# Patient Record
Sex: Female | Born: 1965 | Race: White | Hispanic: No | Marital: Married | Smoking: Current every day smoker
Health system: Southern US, Community
[De-identification: ages and names within clinical notes are randomized; demographics above are authoritative.]

## PROBLEM LIST (undated history)

## (undated) DIAGNOSIS — J45909 Unspecified asthma, uncomplicated: Secondary | ICD-10-CM

## (undated) DIAGNOSIS — J329 Chronic sinusitis, unspecified: Secondary | ICD-10-CM

## (undated) DIAGNOSIS — J189 Pneumonia, unspecified organism: Secondary | ICD-10-CM

## (undated) DIAGNOSIS — F319 Bipolar disorder, unspecified: Secondary | ICD-10-CM

## (undated) DIAGNOSIS — J449 Chronic obstructive pulmonary disease, unspecified: Secondary | ICD-10-CM

## (undated) DIAGNOSIS — J40 Bronchitis, not specified as acute or chronic: Secondary | ICD-10-CM

## (undated) DIAGNOSIS — K649 Unspecified hemorrhoids: Secondary | ICD-10-CM

## (undated) DIAGNOSIS — J301 Allergic rhinitis due to pollen: Secondary | ICD-10-CM

## (undated) DIAGNOSIS — E785 Hyperlipidemia, unspecified: Secondary | ICD-10-CM

## (undated) DIAGNOSIS — I341 Nonrheumatic mitral (valve) prolapse: Secondary | ICD-10-CM

## (undated) DIAGNOSIS — A809 Acute poliomyelitis, unspecified: Secondary | ICD-10-CM

## (undated) DIAGNOSIS — F419 Anxiety disorder, unspecified: Secondary | ICD-10-CM

## (undated) HISTORY — DX: Nonrheumatic mitral (valve) prolapse: I34.1

## (undated) HISTORY — DX: Unspecified hemorrhoids: K64.9

## (undated) HISTORY — DX: Pneumonia, unspecified organism: J18.9

## (undated) HISTORY — DX: Chronic obstructive pulmonary disease, unspecified: J44.9

## (undated) HISTORY — DX: Hyperlipidemia, unspecified: E78.5

## (undated) HISTORY — DX: Acute poliomyelitis, unspecified: A80.9

## (undated) HISTORY — DX: Anxiety disorder, unspecified: F41.9

## (undated) HISTORY — DX: Bipolar disorder, unspecified: F31.9

## (undated) HISTORY — DX: Allergic rhinitis due to pollen: J30.1

## (undated) HISTORY — DX: Unspecified asthma, uncomplicated: J45.909

## (undated) HISTORY — DX: Bronchitis, not specified as acute or chronic: J40

## (undated) HISTORY — DX: Chronic sinusitis, unspecified: J32.9

---

## 2000-11-24 ENCOUNTER — Ambulatory Visit (HOSPITAL_COMMUNITY): Admission: RE | Admit: 2000-11-24 | Discharge: 2000-11-24 | Payer: Self-pay | Admitting: Obstetrics and Gynecology

## 2000-11-24 ENCOUNTER — Encounter: Payer: Self-pay | Admitting: Obstetrics and Gynecology

## 2000-12-04 ENCOUNTER — Inpatient Hospital Stay (HOSPITAL_COMMUNITY): Admission: EM | Admit: 2000-12-04 | Discharge: 2000-12-08 | Payer: Self-pay | Admitting: Psychiatry

## 2000-12-04 ENCOUNTER — Encounter (HOSPITAL_COMMUNITY): Payer: Self-pay | Admitting: Psychiatry

## 2002-10-17 ENCOUNTER — Other Ambulatory Visit: Admission: RE | Admit: 2002-10-17 | Discharge: 2002-10-17 | Payer: Self-pay

## 2003-04-24 ENCOUNTER — Emergency Department (HOSPITAL_COMMUNITY): Admission: EM | Admit: 2003-04-24 | Discharge: 2003-04-24 | Payer: Self-pay | Admitting: Emergency Medicine

## 2003-08-24 ENCOUNTER — Emergency Department (HOSPITAL_COMMUNITY): Admission: EM | Admit: 2003-08-24 | Discharge: 2003-08-24 | Payer: Self-pay | Admitting: Emergency Medicine

## 2005-01-24 ENCOUNTER — Emergency Department (HOSPITAL_COMMUNITY): Admission: EM | Admit: 2005-01-24 | Discharge: 2005-01-25 | Payer: Self-pay | Admitting: Emergency Medicine

## 2005-12-28 ENCOUNTER — Emergency Department (HOSPITAL_COMMUNITY): Admission: EM | Admit: 2005-12-28 | Discharge: 2005-12-28 | Payer: Self-pay | Admitting: Emergency Medicine

## 2005-12-31 ENCOUNTER — Emergency Department (HOSPITAL_COMMUNITY): Admission: EM | Admit: 2005-12-31 | Discharge: 2005-12-31 | Payer: Self-pay | Admitting: Emergency Medicine

## 2006-04-03 ENCOUNTER — Other Ambulatory Visit: Payer: Self-pay

## 2006-04-03 ENCOUNTER — Emergency Department: Payer: Self-pay | Admitting: Emergency Medicine

## 2006-04-17 ENCOUNTER — Inpatient Hospital Stay: Payer: Self-pay | Admitting: Internal Medicine

## 2006-04-17 ENCOUNTER — Other Ambulatory Visit: Payer: Self-pay

## 2006-08-05 ENCOUNTER — Emergency Department: Payer: Self-pay | Admitting: Emergency Medicine

## 2006-08-05 ENCOUNTER — Other Ambulatory Visit: Payer: Self-pay

## 2007-02-10 ENCOUNTER — Emergency Department: Payer: Self-pay | Admitting: Emergency Medicine

## 2007-02-15 ENCOUNTER — Emergency Department: Payer: Self-pay | Admitting: Emergency Medicine

## 2007-02-16 ENCOUNTER — Ambulatory Visit: Payer: Self-pay | Admitting: Emergency Medicine

## 2007-05-13 ENCOUNTER — Emergency Department: Payer: Self-pay | Admitting: Emergency Medicine

## 2007-07-10 ENCOUNTER — Ambulatory Visit: Payer: Self-pay

## 2007-07-12 ENCOUNTER — Ambulatory Visit: Payer: Self-pay

## 2007-11-29 ENCOUNTER — Other Ambulatory Visit: Payer: Self-pay

## 2007-11-29 ENCOUNTER — Emergency Department: Payer: Self-pay | Admitting: Emergency Medicine

## 2007-12-02 ENCOUNTER — Other Ambulatory Visit: Payer: Self-pay

## 2007-12-02 ENCOUNTER — Emergency Department: Payer: Self-pay | Admitting: Emergency Medicine

## 2008-01-09 ENCOUNTER — Ambulatory Visit: Payer: Self-pay

## 2008-02-16 ENCOUNTER — Emergency Department: Payer: Self-pay | Admitting: Emergency Medicine

## 2008-04-02 ENCOUNTER — Emergency Department: Payer: Self-pay | Admitting: Emergency Medicine

## 2008-07-28 ENCOUNTER — Emergency Department: Payer: Self-pay

## 2008-07-31 ENCOUNTER — Emergency Department: Payer: Self-pay | Admitting: Emergency Medicine

## 2008-09-15 ENCOUNTER — Emergency Department: Payer: Self-pay | Admitting: Emergency Medicine

## 2008-11-05 ENCOUNTER — Ambulatory Visit: Payer: Self-pay

## 2008-12-26 ENCOUNTER — Emergency Department: Payer: Self-pay | Admitting: Internal Medicine

## 2009-04-20 ENCOUNTER — Ambulatory Visit: Payer: Self-pay | Admitting: Internal Medicine

## 2009-04-20 ENCOUNTER — Emergency Department: Payer: Self-pay | Admitting: Emergency Medicine

## 2009-04-21 ENCOUNTER — Ambulatory Visit: Payer: Self-pay | Admitting: Internal Medicine

## 2009-05-01 ENCOUNTER — Emergency Department: Payer: Self-pay | Admitting: Emergency Medicine

## 2009-05-23 ENCOUNTER — Emergency Department: Payer: Self-pay | Admitting: Internal Medicine

## 2009-06-26 ENCOUNTER — Emergency Department (HOSPITAL_COMMUNITY): Admission: EM | Admit: 2009-06-26 | Discharge: 2009-06-26 | Payer: Self-pay | Admitting: Emergency Medicine

## 2009-08-27 ENCOUNTER — Emergency Department: Payer: Self-pay | Admitting: Emergency Medicine

## 2009-09-23 ENCOUNTER — Emergency Department: Payer: Self-pay | Admitting: Internal Medicine

## 2009-09-24 ENCOUNTER — Ambulatory Visit: Payer: Self-pay | Admitting: Internal Medicine

## 2010-02-05 ENCOUNTER — Emergency Department: Payer: Self-pay | Admitting: Emergency Medicine

## 2010-03-02 ENCOUNTER — Ambulatory Visit: Payer: Self-pay

## 2010-03-13 ENCOUNTER — Emergency Department: Payer: Self-pay | Admitting: Emergency Medicine

## 2010-03-14 ENCOUNTER — Emergency Department: Payer: Self-pay | Admitting: Emergency Medicine

## 2010-04-05 ENCOUNTER — Emergency Department: Payer: Self-pay | Admitting: Emergency Medicine

## 2010-04-06 ENCOUNTER — Emergency Department: Payer: Self-pay | Admitting: Unknown Physician Specialty

## 2010-04-24 ENCOUNTER — Inpatient Hospital Stay: Payer: Self-pay | Admitting: Psychiatry

## 2010-05-18 ENCOUNTER — Emergency Department (HOSPITAL_COMMUNITY)
Admission: EM | Admit: 2010-05-18 | Discharge: 2010-05-18 | Payer: Self-pay | Source: Home / Self Care | Admitting: Emergency Medicine

## 2010-07-15 ENCOUNTER — Emergency Department (HOSPITAL_COMMUNITY)
Admission: EM | Admit: 2010-07-15 | Discharge: 2010-07-15 | Disposition: A | Discharge status: Home / Self Care | Payer: Self-pay | Attending: Emergency Medicine | Admitting: Emergency Medicine

## 2010-07-15 ENCOUNTER — Emergency Department (HOSPITAL_COMMUNITY): Payer: Self-pay

## 2010-07-15 DIAGNOSIS — R072 Precordial pain: Secondary | ICD-10-CM | POA: Insufficient documentation

## 2010-07-15 DIAGNOSIS — F410 Panic disorder [episodic paroxysmal anxiety] without agoraphobia: Secondary | ICD-10-CM | POA: Insufficient documentation

## 2010-07-15 DIAGNOSIS — R0602 Shortness of breath: Secondary | ICD-10-CM | POA: Insufficient documentation

## 2010-07-15 LAB — CBC
MCV: 97.8 fL (ref 78.0–100.0)
Platelets: 291 10*3/uL (ref 150–400)
RDW: 13.2 % (ref 11.5–15.5)
WBC: 13.4 10*3/uL — ABNORMAL HIGH (ref 4.0–10.5)

## 2010-07-15 LAB — URINALYSIS, ROUTINE W REFLEX MICROSCOPIC
Bilirubin Urine: NEGATIVE
Nitrite: NEGATIVE
Specific Gravity, Urine: 1.01 (ref 1.005–1.030)
Urobilinogen, UA: 0.2 mg/dL (ref 0.0–1.0)

## 2010-07-15 LAB — DIFFERENTIAL
Basophils Absolute: 0.1 10*3/uL (ref 0.0–0.1)
Basophils Relative: 1 % (ref 0–1)
Eosinophils Absolute: 0.6 10*3/uL (ref 0.0–0.7)
Eosinophils Relative: 4 % (ref 0–5)

## 2010-07-15 LAB — BASIC METABOLIC PANEL
BUN: 12 mg/dL (ref 6–23)
Calcium: 9.5 mg/dL (ref 8.4–10.5)
GFR calc non Af Amer: >60 mL/min (ref >60)
Glucose, Bld: 100 mg/dL — ABNORMAL HIGH (ref 70–99)

## 2010-07-16 LAB — POCT PREGNANCY, URINE: Preg Test, Ur: NEGATIVE

## 2010-07-29 ENCOUNTER — Emergency Department (HOSPITAL_COMMUNITY)
Admission: EM | Admit: 2010-07-29 | Discharge: 2010-07-29 | Disposition: A | Discharge status: Home / Self Care | Payer: Self-pay | Attending: Emergency Medicine | Admitting: Emergency Medicine

## 2010-07-29 DIAGNOSIS — R112 Nausea with vomiting, unspecified: Secondary | ICD-10-CM | POA: Insufficient documentation

## 2010-07-29 DIAGNOSIS — R197 Diarrhea, unspecified: Secondary | ICD-10-CM | POA: Insufficient documentation

## 2010-07-29 DIAGNOSIS — R109 Unspecified abdominal pain: Secondary | ICD-10-CM | POA: Insufficient documentation

## 2010-07-29 LAB — URINALYSIS, ROUTINE W REFLEX MICROSCOPIC
Specific Gravity, Urine: 1.02 (ref 1.005–1.030)
Urine Glucose, Fasting: NEGATIVE mg/dL
pH: 6 (ref 5.0–8.0)

## 2010-07-29 LAB — DIFFERENTIAL
Eosinophils Absolute: 0.4 10*3/uL (ref 0.0–0.7)
Eosinophils Relative: 4 % (ref 0–5)
Lymphs Abs: 1.1 10*3/uL (ref 0.7–4.0)

## 2010-07-29 LAB — CBC
MCH: 33.9 pg (ref 26.0–34.0)
MCV: 100.9 fL — ABNORMAL HIGH (ref 78.0–100.0)
Platelets: 252 10*3/uL (ref 150–400)
RDW: 13.1 % (ref 11.5–15.5)
WBC: 8.8 10*3/uL (ref 4.0–10.5)

## 2010-07-29 LAB — LIPASE, BLOOD: Lipase: 29 U/L (ref 11–59)

## 2010-07-29 LAB — COMPREHENSIVE METABOLIC PANEL
Albumin: 3.7 g/dL (ref 3.5–5.2)
BUN: 16 mg/dL (ref 6–23)
Calcium: 8.7 mg/dL (ref 8.4–10.5)
Creatinine, Ser: 0.76 mg/dL (ref 0.4–1.2)
Potassium: 3.9 mEq/L (ref 3.5–5.1)
Total Protein: 6.3 g/dL (ref 6.0–8.3)

## 2010-07-29 LAB — URINE MICROSCOPIC-ADD ON

## 2010-08-16 LAB — COMPREHENSIVE METABOLIC PANEL
Albumin: 4.2 g/dL (ref 3.5–5.2)
Alkaline Phosphatase: 60 U/L (ref 39–117)
BUN: 10 mg/dL (ref 6–23)
Calcium: 9.6 mg/dL (ref 8.4–10.5)
Potassium: 4 mEq/L (ref 3.5–5.1)
Total Protein: 7.2 g/dL (ref 6.0–8.3)

## 2010-08-16 LAB — URINALYSIS, ROUTINE W REFLEX MICROSCOPIC
Bilirubin Urine: NEGATIVE
Hgb urine dipstick: NEGATIVE
Ketones, ur: NEGATIVE mg/dL
Nitrite: NEGATIVE
Urobilinogen, UA: 0.2 mg/dL (ref 0.0–1.0)

## 2010-08-16 LAB — WET PREP, GENITAL

## 2010-08-16 LAB — RPR: RPR Ser Ql: NONREACTIVE

## 2010-08-16 LAB — GC/CHLAMYDIA PROBE AMP, GENITAL
Chlamydia, DNA Probe: NEGATIVE
GC Probe Amp, Genital: NEGATIVE

## 2010-08-22 LAB — WET PREP, GENITAL
Trich, Wet Prep: NONE SEEN
Yeast Wet Prep HPF POC: NONE SEEN

## 2010-08-22 LAB — URINALYSIS, ROUTINE W REFLEX MICROSCOPIC
Glucose, UA: NEGATIVE mg/dL
Ketones, ur: NEGATIVE mg/dL
Nitrite: NEGATIVE
Specific Gravity, Urine: 1.025 (ref 1.005–1.030)
pH: 6 (ref 5.0–8.0)

## 2010-08-22 LAB — PREGNANCY, URINE: Preg Test, Ur: NEGATIVE

## 2010-09-16 ENCOUNTER — Emergency Department (HOSPITAL_COMMUNITY): Payer: Self-pay

## 2010-09-16 ENCOUNTER — Emergency Department (HOSPITAL_COMMUNITY)
Admission: EM | Admit: 2010-09-16 | Discharge: 2010-09-16 | Disposition: A | Discharge status: Home / Self Care | Payer: Self-pay | Attending: Emergency Medicine | Admitting: Emergency Medicine

## 2010-09-16 DIAGNOSIS — K5900 Constipation, unspecified: Secondary | ICD-10-CM | POA: Insufficient documentation

## 2010-09-16 DIAGNOSIS — R109 Unspecified abdominal pain: Secondary | ICD-10-CM | POA: Insufficient documentation

## 2010-09-16 DIAGNOSIS — M549 Dorsalgia, unspecified: Secondary | ICD-10-CM | POA: Insufficient documentation

## 2010-09-16 LAB — COMPREHENSIVE METABOLIC PANEL WITH GFR
ALT: 32 U/L (ref 0–35)
AST: 28 U/L (ref 0–37)
Albumin: 3.7 g/dL (ref 3.5–5.2)
Alkaline Phosphatase: 64 U/L (ref 39–117)
BUN: 6 mg/dL (ref 6–23)
CO2: 23 meq/L (ref 19–32)
Calcium: 8.9 mg/dL (ref 8.4–10.5)
Chloride: 108 meq/L (ref 96–112)
Creatinine, Ser: 0.78 mg/dL (ref 0.4–1.2)
GFR calc Af Amer: >60 mL/min (ref >60)
GFR calc non Af Amer: >60 mL/min (ref >60)
Glucose, Bld: 81 mg/dL (ref 70–99)
Potassium: 3.8 meq/L (ref 3.5–5.1)
Sodium: 137 meq/L (ref 135–145)
Total Bilirubin: 0.4 mg/dL (ref 0.3–1.2)
Total Protein: 6.5 g/dL (ref 6.0–8.3)

## 2010-09-16 LAB — CBC
HCT: 38.7 % (ref 36.0–46.0)
Hemoglobin: 13.1 g/dL (ref 12.0–15.0)
MCH: 33.9 pg (ref 26.0–34.0)
MCHC: 33.9 g/dL (ref 30.0–36.0)
MCV: 100 fL (ref 78.0–100.0)
Platelets: 223 K/uL (ref 150–400)
RBC: 3.87 MIL/uL (ref 3.87–5.11)
RDW: 12.5 % (ref 11.5–15.5)
WBC: 8.8 K/uL (ref 4.0–10.5)

## 2010-09-16 LAB — URINALYSIS, ROUTINE W REFLEX MICROSCOPIC
Bilirubin Urine: NEGATIVE
Glucose, UA: NEGATIVE mg/dL
Hgb urine dipstick: NEGATIVE
Ketones, ur: NEGATIVE mg/dL
Specific Gravity, Urine: 1.01 (ref 1.005–1.030)
pH: 7 (ref 5.0–8.0)

## 2010-09-16 LAB — DIFFERENTIAL
Basophils Absolute: 0.1 10*3/uL (ref 0.0–0.1)
Eosinophils Relative: 5 % (ref 0–5)
Lymphocytes Relative: 41 % (ref 12–46)
Lymphs Abs: 3.6 10*3/uL (ref 0.7–4.0)
Monocytes Absolute: 0.8 10*3/uL (ref 0.1–1.0)
Neutro Abs: 3.9 10*3/uL (ref 1.7–7.7)
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
## 2010-11-17 IMAGING — CR DG CHEST 2V
1 series · 3 of 3 positions shown · non-contrast
Comparison: none

REASON FOR EXAM: SOB
COMMENTS:

[Series 1: view not recorded · 0.17mm/px · 3 of 3 slices shown]
[im 1/3]
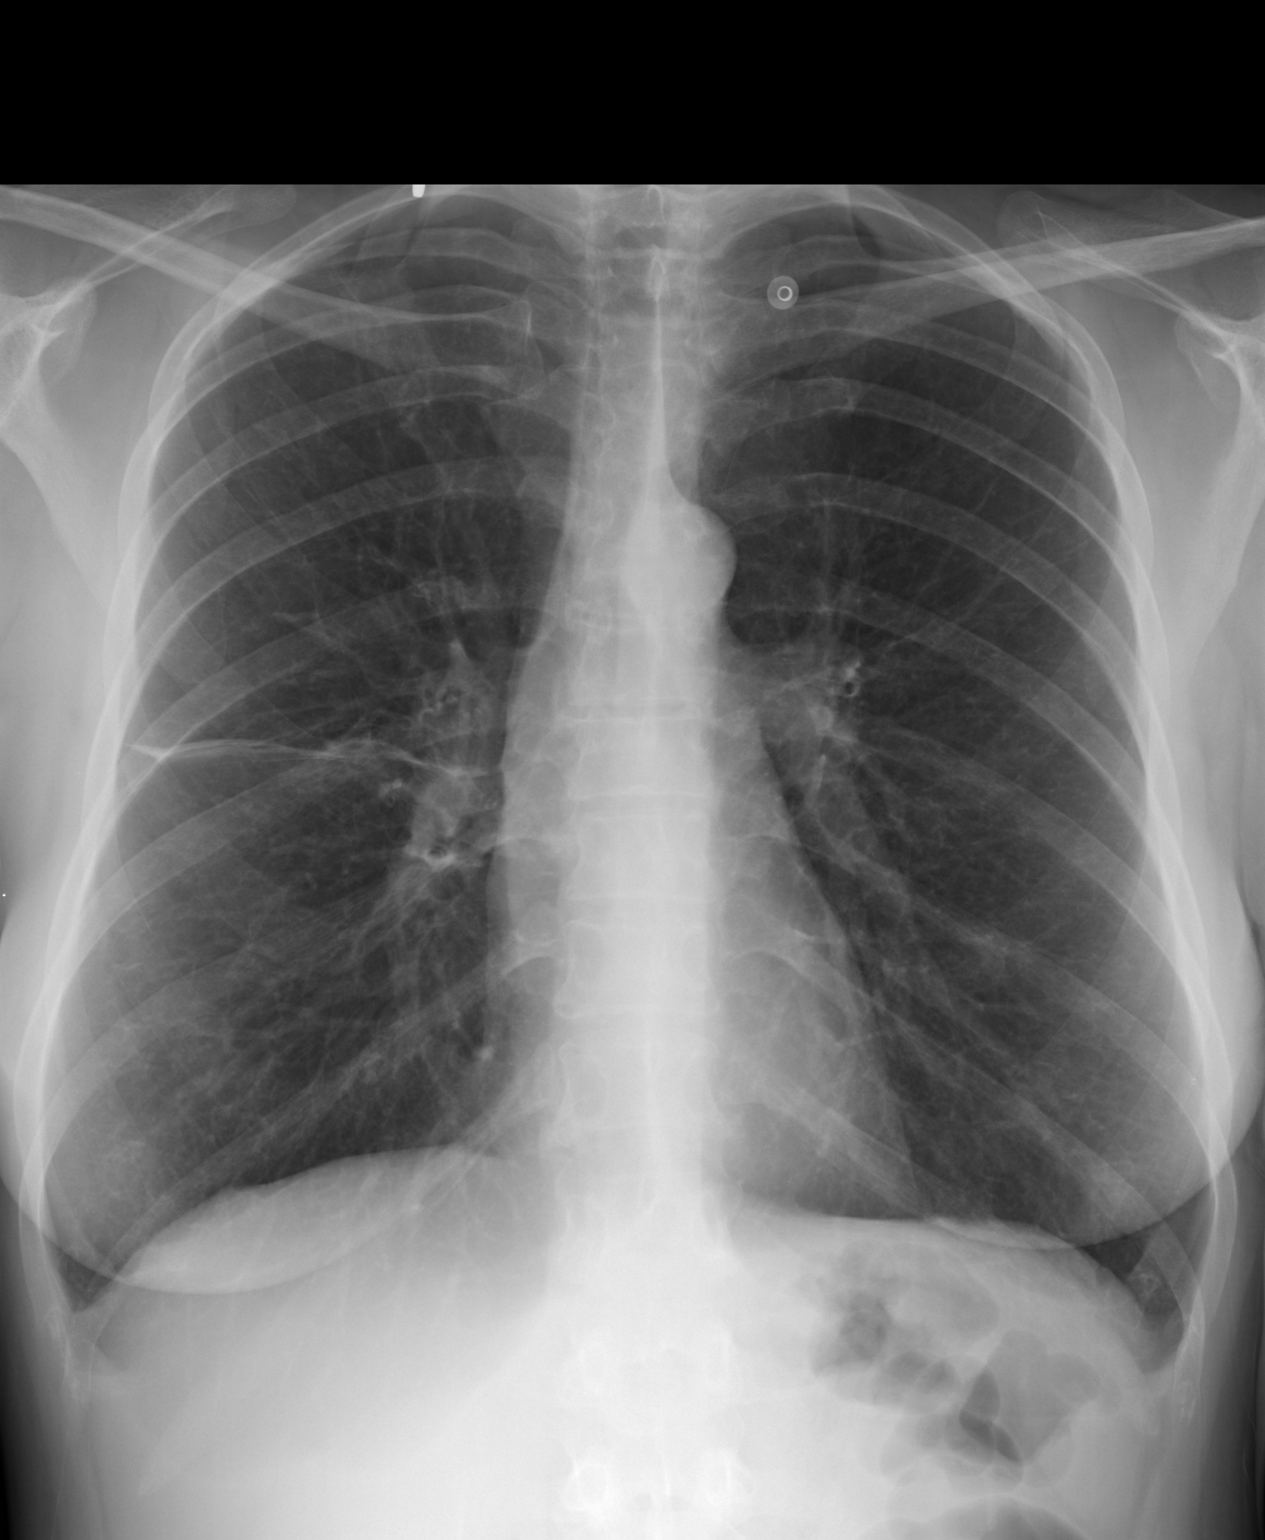
[im 2/3]
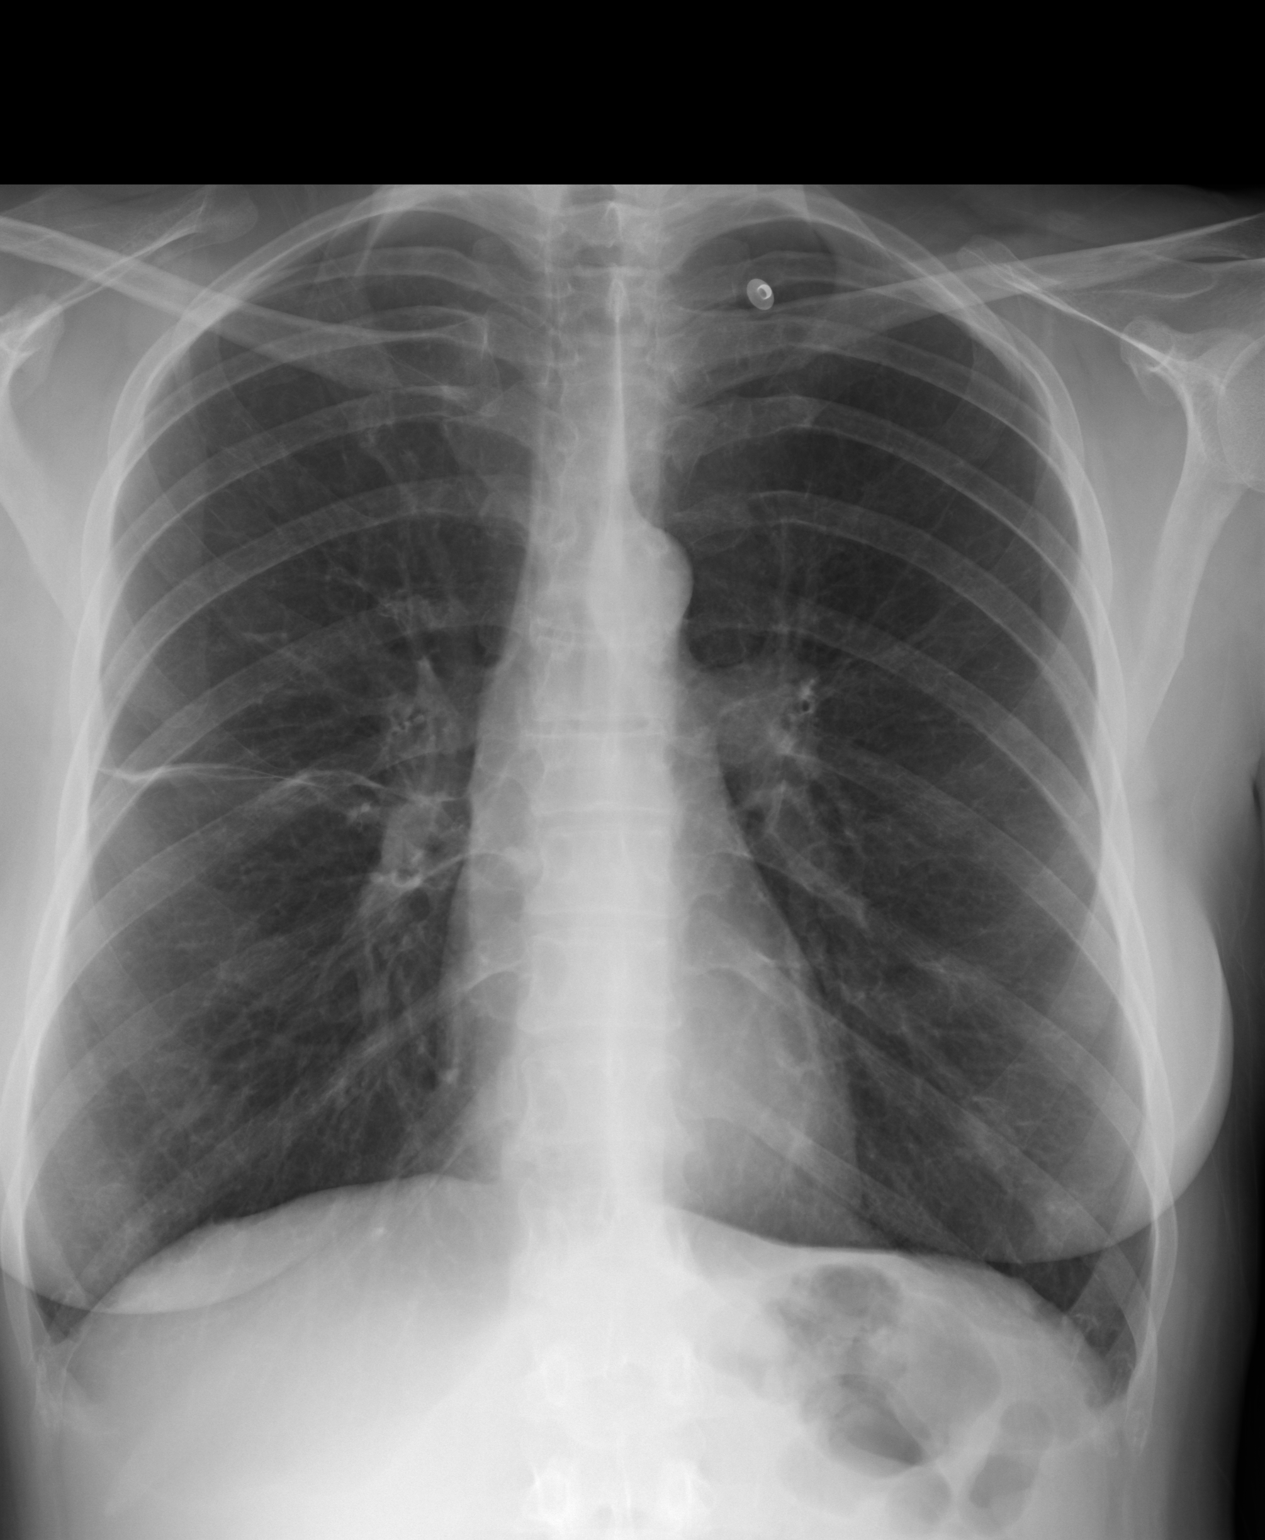
[im 3/3]
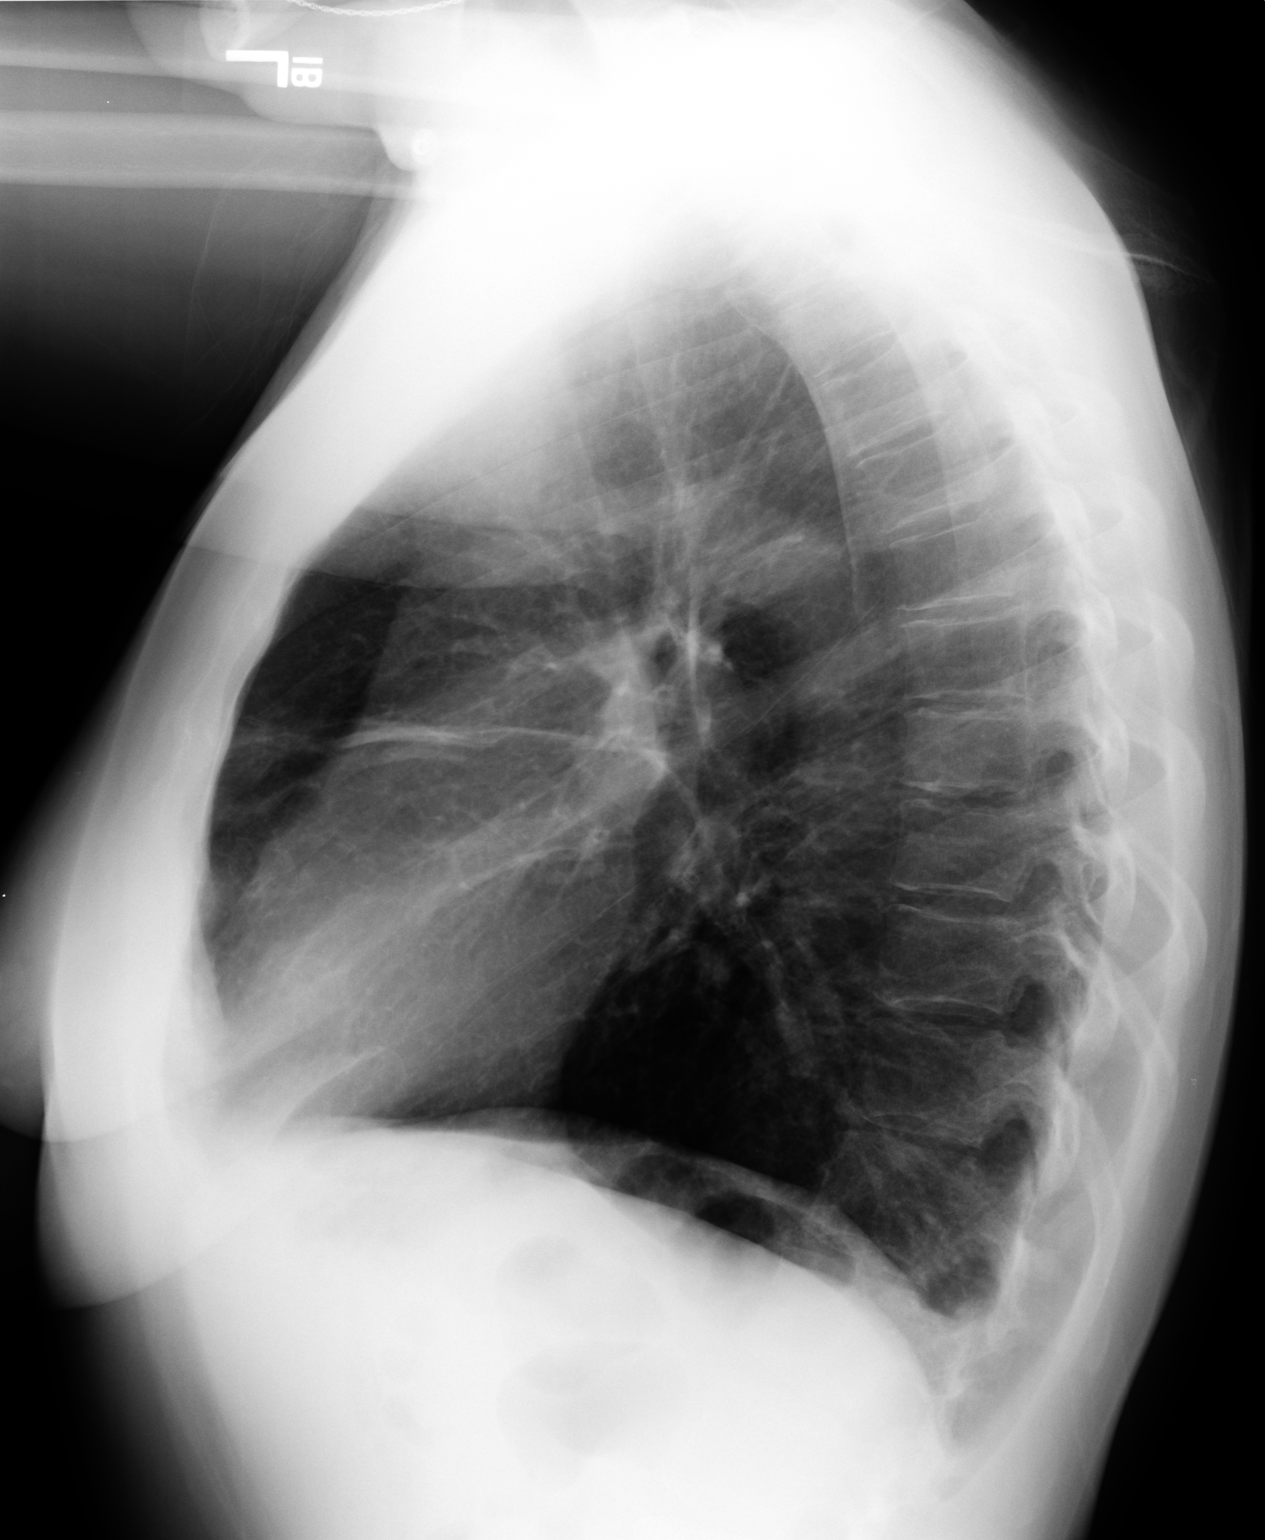

[3 of 3 positions shown; findings below may reference images not displayed]

PROCEDURE:     DXR - DXR CHEST PA (OR AP) AND LATERAL  - September 23, 2009  [DATE]

RESULT:     Comparison is made to the study of 08/27/2009.

There is thickening of the minor fissure. The lungs are otherwise clear
except for blunting of the right costophrenic angle possibly secondary to a
trace right pleural effusion. There is thickening in the major fissure on
the left as well. The cardiac silhouette is normal. There is no
pneumothorax, infiltrate or edema. The cardiac silhouette is normal.
IMPRESSION: 1. Trace right pleural effusion.

## 2010-12-09 ENCOUNTER — Emergency Department: Payer: Self-pay | Admitting: Emergency Medicine

## 2011-01-06 ENCOUNTER — Ambulatory Visit: Payer: Self-pay | Admitting: Internal Medicine

## 2011-03-21 ENCOUNTER — Emergency Department: Payer: Self-pay | Admitting: Emergency Medicine

## 2011-04-26 IMAGING — US US BREAST BILAT
1 series · 17 of 23 positions shown · non-contrast
Comparison: none

REASON FOR EXAM: green right nipple discharge/2 cm nodule [DATE] left
breast
COMMENTS:

[Series 1: us breast bilat · 17 of 23 slices shown]
[im 1/23]
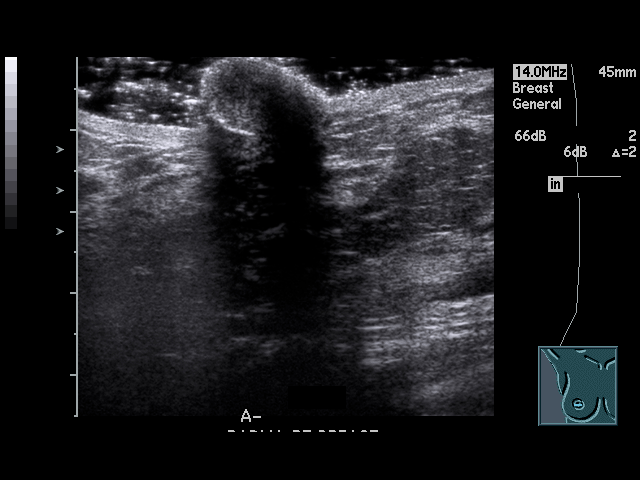
[im 3/23]
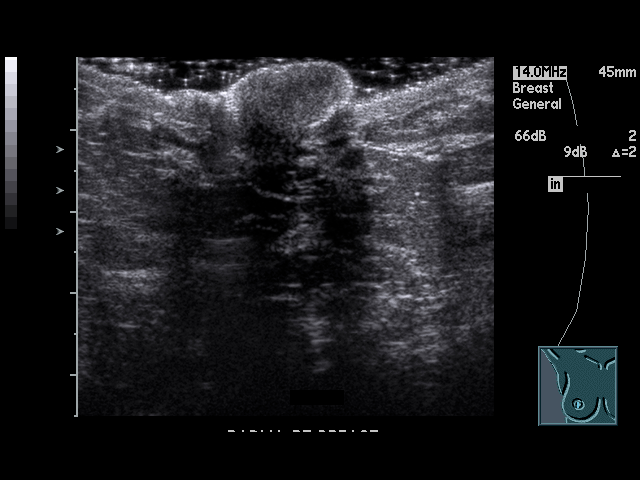
[im 4/23]
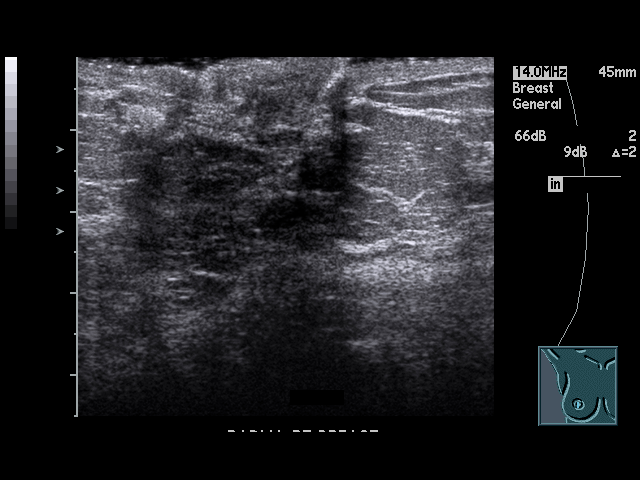
[im 5/23]
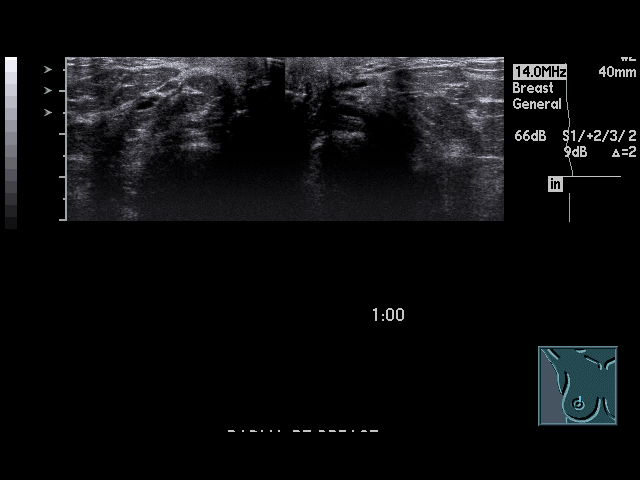
[im 7/23]
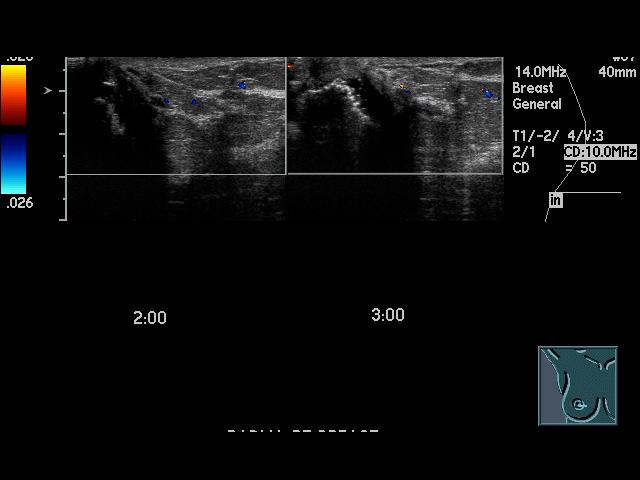
[im 8/23]
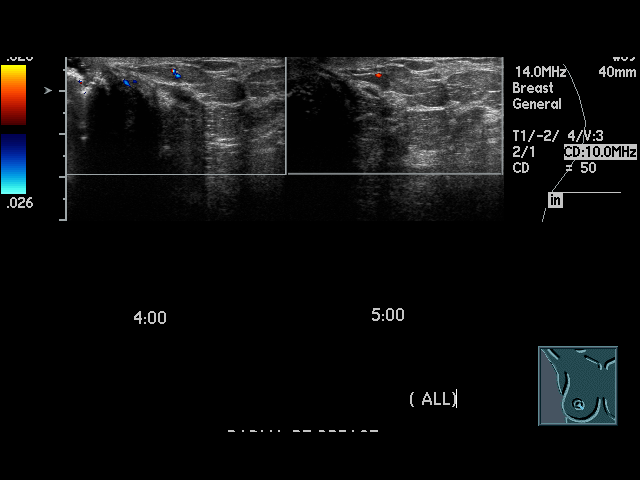
[im 9/23]
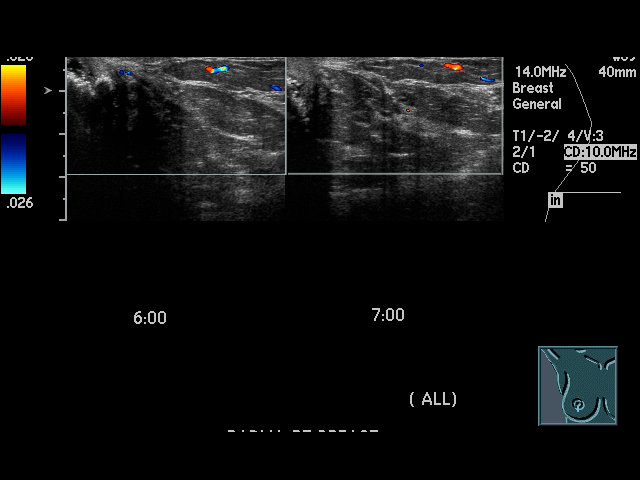
[im 11/23]
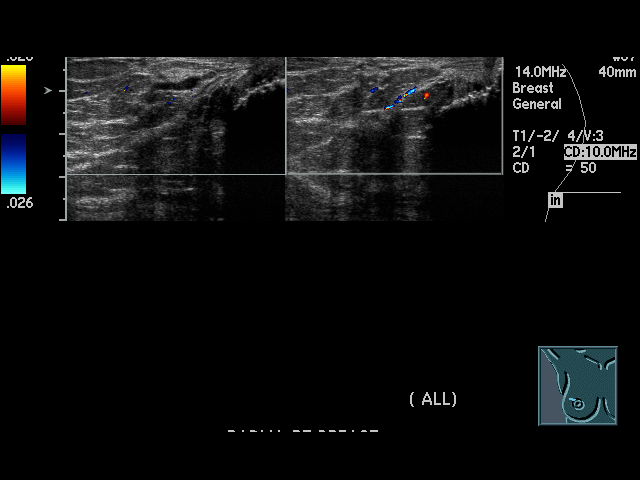
[im 12/23]
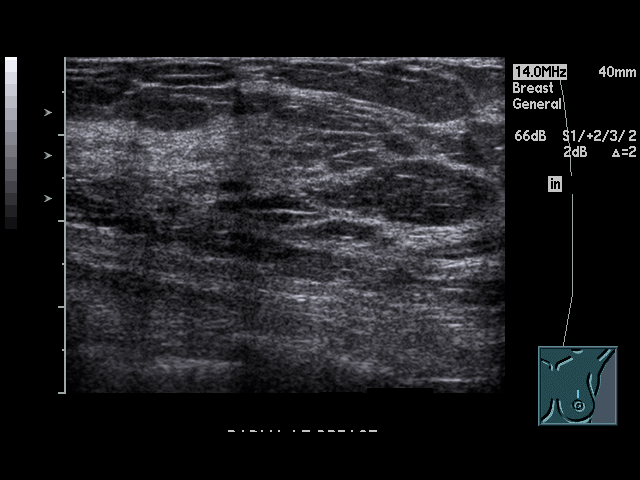
[im 13/23]
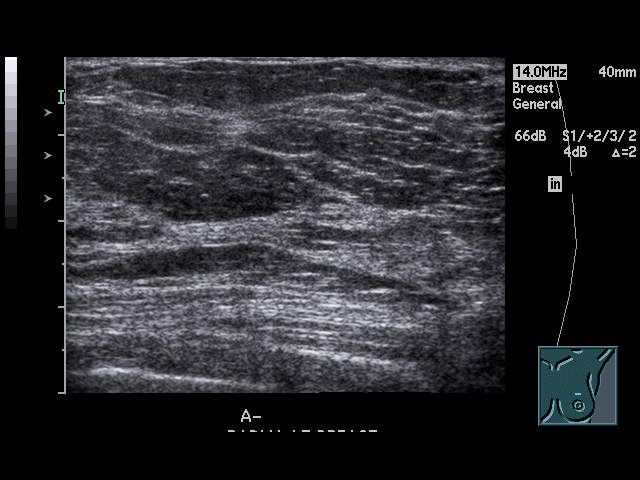
[im 15/23]
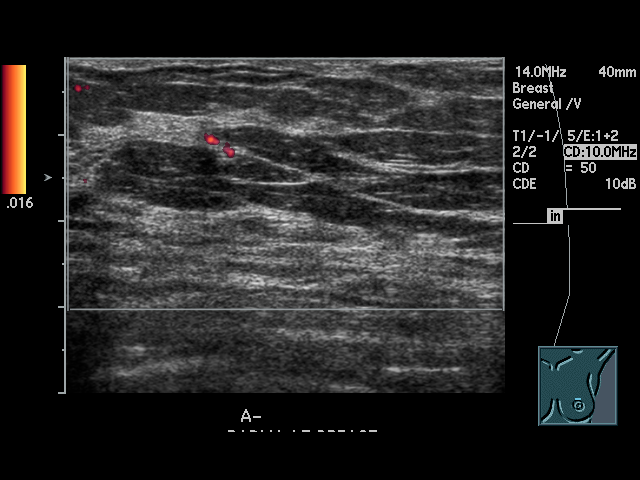
[im 16/23]
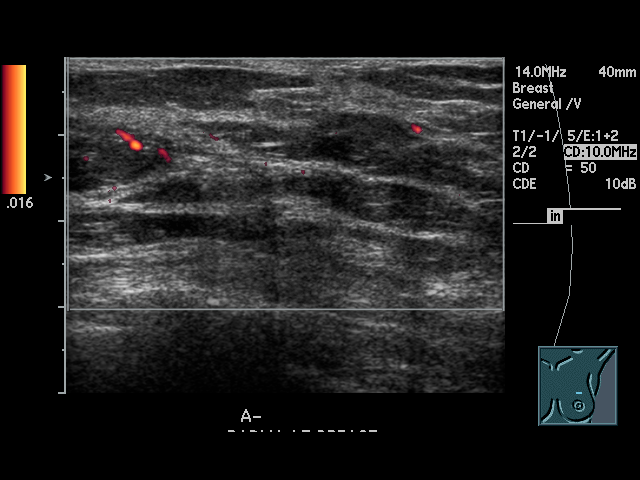
[im 17/23]
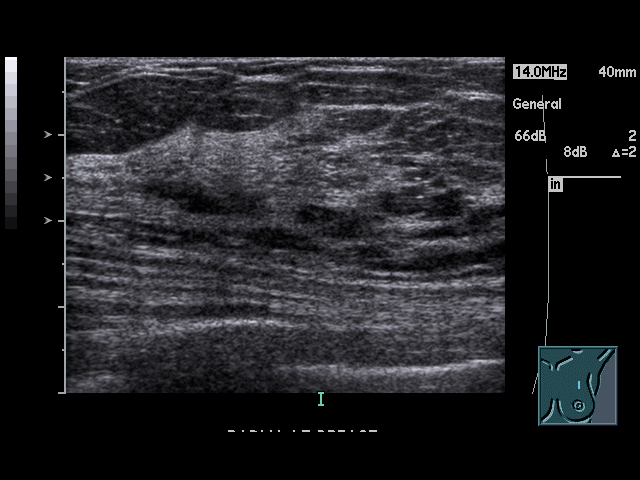
[im 19/23]
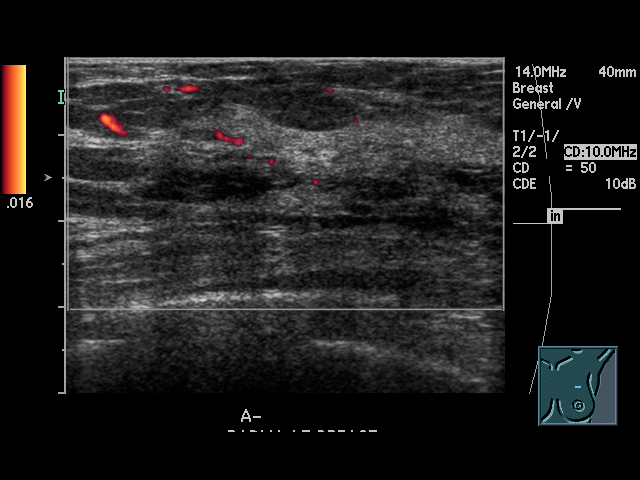
[im 20/23]
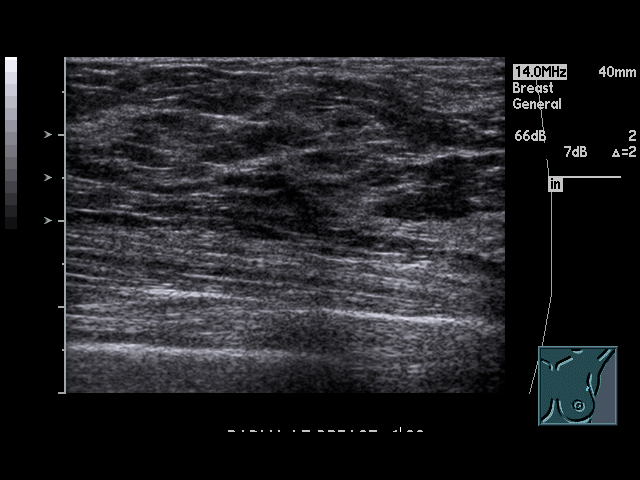
[im 21/23]
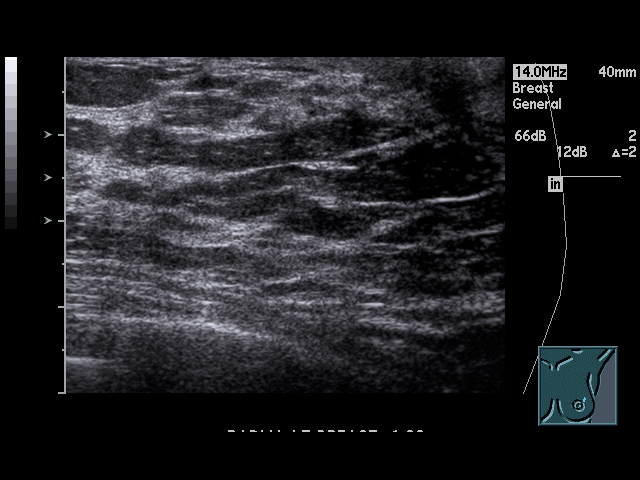
[im 23/23]
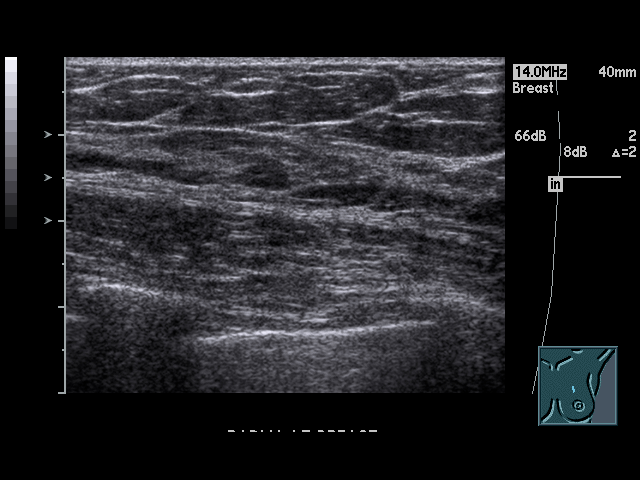

[17 of 23 positions shown; findings below may reference images not displayed]

PROCEDURE:     US  - US BIL BREAST ([REDACTED])  - March 02, 2010 [DATE]

RESULT:     Bilateral Breast Ultrasound reveals prominent ducts above the
right nipple. No abnormality otherwise identified. There is no evidence of
mass lesion. The previously identified mass lesion in the left breast is no
longer identified.
IMPRESSION: Right breast ductal ectasia. Given the patient's symptoms,
surgical consultation should be considered.

BI-RADS: Category 4 - Suspicious Abnormality
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
## 2011-07-12 ENCOUNTER — Emergency Department: Payer: Self-pay | Admitting: Emergency Medicine

## 2011-07-12 LAB — COMPREHENSIVE METABOLIC PANEL
Albumin: 3.4 g/dL (ref 3.4–5.0)
Anion Gap: 9 (ref 7–16)
Calcium, Total: 8.7 mg/dL (ref 8.5–10.1)
Chloride: 109 mmol/L — ABNORMAL HIGH (ref 98–107)
EGFR (African American): >60
Glucose: 82 mg/dL (ref 65–99)
Potassium: 3.8 mmol/L (ref 3.5–5.1)
SGOT(AST): 16 U/L (ref 15–37)

## 2011-07-12 LAB — URINALYSIS, COMPLETE
Nitrite: NEGATIVE
Protein: NEGATIVE
Specific Gravity: 1.005 (ref 1.003–1.030)
Squamous Epithelial: 4

## 2011-07-12 LAB — CBC WITH DIFFERENTIAL/PLATELET
Basophil #: 0.1 10*3/uL (ref 0.0–0.1)
Eosinophil #: 0.6 10*3/uL (ref 0.0–0.7)
HCT: 36.5 % (ref 35.0–47.0)
Lymphocyte #: 3.4 10*3/uL (ref 1.0–3.6)
Lymphocyte %: 36.6 %
MCHC: 33.5 g/dL (ref 32.0–36.0)
Monocyte #: 1.1 10*3/uL — ABNORMAL HIGH (ref 0.0–0.7)
Neutrophil #: 4.1 10*3/uL (ref 1.4–6.5)
Platelet: 223 10*3/uL (ref 150–440)
RDW: 11.7 % (ref 11.5–14.5)

## 2011-07-12 LAB — DRUG SCREEN, URINE
Cannabinoid 50 Ng, Ur ~~LOC~~: NEGATIVE (ref ?–50)
Cocaine Metabolite,Ur ~~LOC~~: NEGATIVE (ref ?–300)
MDMA (Ecstasy)Ur Screen: NEGATIVE (ref ?–500)
Opiate, Ur Screen: NEGATIVE (ref ?–300)
Phencyclidine (PCP) Ur S: NEGATIVE (ref ?–25)

## 2012-08-20 ENCOUNTER — Emergency Department: Payer: Self-pay | Admitting: Unknown Physician Specialty

## 2012-10-23 ENCOUNTER — Emergency Department: Payer: Self-pay | Admitting: Emergency Medicine

## 2012-10-23 LAB — BASIC METABOLIC PANEL
BUN: 11 mg/dL (ref 7–18)
Calcium, Total: 8.9 mg/dL (ref 8.5–10.1)
Chloride: 109 mmol/L — ABNORMAL HIGH (ref 98–107)
Creatinine: 0.76 mg/dL (ref 0.60–1.30)
Glucose: 96 mg/dL (ref 65–99)
Potassium: 4.3 mmol/L (ref 3.5–5.1)

## 2012-10-23 LAB — CBC
HGB: 14.2 g/dL (ref 12.0–16.0)
MCH: 34.5 pg — ABNORMAL HIGH (ref 26.0–34.0)
MCV: 99 fL (ref 80–100)
RBC: 4.11 10*6/uL (ref 3.80–5.20)
RDW: 13.1 % (ref 11.5–14.5)

## 2012-10-23 LAB — TROPONIN I: Troponin-I: <0.02 ng/mL

## 2012-11-05 ENCOUNTER — Emergency Department: Payer: Self-pay | Admitting: Emergency Medicine

## 2012-11-05 LAB — DRUG SCREEN, URINE
Amphetamines, Ur Screen: NEGATIVE (ref ?–1000)
Barbiturates, Ur Screen: NEGATIVE (ref ?–200)
Benzodiazepine, Ur Scrn: NEGATIVE (ref ?–200)
Methadone, Ur Screen: NEGATIVE (ref ?–300)
Phencyclidine (PCP) Ur S: NEGATIVE (ref ?–25)
Tricyclic, Ur Screen: NEGATIVE (ref ?–1000)

## 2012-11-05 LAB — BASIC METABOLIC PANEL
BUN: 14 mg/dL (ref 7–18)
EGFR (African American): >60
Glucose: 72 mg/dL (ref 65–99)
Osmolality: 275 (ref 275–301)
Potassium: 3.7 mmol/L (ref 3.5–5.1)

## 2012-11-05 LAB — CBC
HGB: 14 g/dL (ref 12.0–16.0)
MCV: 99 fL (ref 80–100)

## 2012-11-05 LAB — URINALYSIS, COMPLETE
Bilirubin,UR: NEGATIVE
Blood: NEGATIVE
Ketone: NEGATIVE
Nitrite: NEGATIVE
Ph: 6 (ref 4.5–8.0)
RBC,UR: 153 /HPF (ref 0–5)
Specific Gravity: 1.023 (ref 1.003–1.030)

## 2013-09-11 ENCOUNTER — Encounter: Payer: Self-pay | Admitting: *Deleted

## 2013-09-20 ENCOUNTER — Ambulatory Visit: Payer: Self-pay | Admitting: Cardiovascular Disease

## 2013-10-05 ENCOUNTER — Emergency Department: Payer: Self-pay | Admitting: Emergency Medicine

## 2013-10-11 LAB — URINALYSIS, COMPLETE
BLOOD: NEGATIVE
Bilirubin,UR: NEGATIVE
GLUCOSE, UR: NEGATIVE mg/dL (ref 0–75)
Hyaline Cast: 17
Leukocyte Esterase: NEGATIVE
Nitrite: NEGATIVE
Ph: 5 (ref 4.5–8.0)
Protein: 30
RBC,UR: 18 /HPF (ref 0–5)
Specific Gravity: 1.032 (ref 1.003–1.030)
Squamous Epithelial: <1

## 2013-10-11 LAB — CBC
HCT: 45.1 % (ref 35.0–47.0)
HGB: 15.4 g/dL (ref 12.0–16.0)
MCH: 34.7 pg — AB (ref 26.0–34.0)
MCHC: 34.1 g/dL (ref 32.0–36.0)
MCV: 102 fL — ABNORMAL HIGH (ref 80–100)
PLATELETS: 338 10*3/uL (ref 150–440)
RBC: 4.44 10*6/uL (ref 3.80–5.20)
RDW: 13.1 % (ref 11.5–14.5)
WBC: 8.7 10*3/uL (ref 3.6–11.0)

## 2013-10-11 LAB — DRUG SCREEN, URINE
AMPHETAMINES, UR SCREEN: NEGATIVE (ref ?–1000)
BARBITURATES, UR SCREEN: NEGATIVE (ref ?–200)
Benzodiazepine, Ur Scrn: POSITIVE (ref ?–200)
CANNABINOID 50 NG, UR ~~LOC~~: NEGATIVE (ref ?–50)
COCAINE METABOLITE, UR ~~LOC~~: POSITIVE (ref ?–300)
MDMA (ECSTASY) UR SCREEN: NEGATIVE (ref ?–500)
METHADONE, UR SCREEN: NEGATIVE (ref ?–300)
OPIATE, UR SCREEN: POSITIVE (ref ?–300)
PHENCYCLIDINE (PCP) UR S: NEGATIVE (ref ?–25)
TRICYCLIC, UR SCREEN: POSITIVE (ref ?–1000)

## 2013-10-11 LAB — COMPREHENSIVE METABOLIC PANEL
ALBUMIN: 3.9 g/dL (ref 3.4–5.0)
ALT: 25 U/L (ref 12–78)
Alkaline Phosphatase: 117 U/L
Anion Gap: 7 (ref 7–16)
BILIRUBIN TOTAL: 0.2 mg/dL (ref 0.2–1.0)
BUN: 13 mg/dL (ref 7–18)
CALCIUM: 9.5 mg/dL (ref 8.5–10.1)
CHLORIDE: 112 mmol/L — AB (ref 98–107)
CREATININE: 0.87 mg/dL (ref 0.60–1.30)
Co2: 23 mmol/L (ref 21–32)
EGFR (African American): >60
GLUCOSE: 112 mg/dL — AB (ref 65–99)
OSMOLALITY: 284 (ref 275–301)
POTASSIUM: 4 mmol/L (ref 3.5–5.1)
SGOT(AST): 26 U/L (ref 15–37)
Sodium: 142 mmol/L (ref 136–145)
Total Protein: 7.9 g/dL (ref 6.4–8.2)

## 2013-10-11 LAB — ACETAMINOPHEN LEVEL: Acetaminophen: <2 ug/mL

## 2013-10-11 LAB — ETHANOL: Ethanol %: <0.003 % (ref 0.000–0.080)

## 2013-10-11 LAB — SALICYLATE LEVEL: SALICYLATES, SERUM: 5.3 mg/dL — AB

## 2013-10-12 ENCOUNTER — Inpatient Hospital Stay: Payer: Self-pay | Admitting: Psychiatry

## 2013-11-14 ENCOUNTER — Emergency Department: Payer: Self-pay | Admitting: Emergency Medicine

## 2014-02-02 ENCOUNTER — Emergency Department: Payer: Self-pay | Admitting: Emergency Medicine

## 2014-02-02 LAB — CBC
HCT: 43.1 % (ref 35.0–47.0)
HGB: 14.3 g/dL (ref 12.0–16.0)
MCH: 33.9 pg (ref 26.0–34.0)
MCHC: 33.2 g/dL (ref 32.0–36.0)
MCV: 102 fL — ABNORMAL HIGH (ref 80–100)
PLATELETS: 305 10*3/uL (ref 150–440)
RBC: 4.23 10*6/uL (ref 3.80–5.20)
RDW: 12.8 % (ref 11.5–14.5)
WBC: 9.8 10*3/uL (ref 3.6–11.0)

## 2014-02-02 LAB — BASIC METABOLIC PANEL
ANION GAP: 5 — AB (ref 7–16)
BUN: 12 mg/dL (ref 7–18)
CALCIUM: 9 mg/dL (ref 8.5–10.1)
CO2: 28 mmol/L (ref 21–32)
CREATININE: 0.94 mg/dL (ref 0.60–1.30)
Chloride: 108 mmol/L — ABNORMAL HIGH (ref 98–107)
EGFR (African American): >60
EGFR (Non-African Amer.): >60
GLUCOSE: 96 mg/dL (ref 65–99)
Osmolality: 281 (ref 275–301)
Potassium: 4.4 mmol/L (ref 3.5–5.1)
Sodium: 141 mmol/L (ref 136–145)

## 2014-05-05 ENCOUNTER — Emergency Department: Payer: Self-pay | Admitting: Student

## 2014-09-24 ENCOUNTER — Emergency Department: Admit: 2014-09-24 | Discharge status: Against Medical Advice | Payer: Self-pay | Admitting: Emergency Medicine
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
## 2014-10-02 ENCOUNTER — Emergency Department
Admit: 2014-10-02 | Disposition: A | Discharge status: Home / Self Care | Payer: Self-pay | Admitting: Emergency Medicine

## 2014-10-02 LAB — CBC
HCT: 45.7 % (ref 35.0–47.0)
HGB: 15.3 g/dL (ref 12.0–16.0)
MCH: 33 pg (ref 26.0–34.0)
MCHC: 33.5 g/dL (ref 32.0–36.0)
MCV: 99 fL (ref 80–100)
Platelet: 339 10*3/uL (ref 150–440)
RBC: 4.63 10*6/uL (ref 3.80–5.20)
RDW: 13.5 % (ref 11.5–14.5)
WBC: 10.6 10*3/uL (ref 3.6–11.0)

## 2014-10-02 LAB — URINALYSIS, COMPLETE
BACTERIA: NONE SEEN
BILIRUBIN, UR: NEGATIVE
GLUCOSE, UR: NEGATIVE mg/dL (ref 0–75)
Ketone: NEGATIVE
LEUKOCYTE ESTERASE: NEGATIVE
Nitrite: NEGATIVE
Ph: 5 (ref 4.5–8.0)
Protein: NEGATIVE
Specific Gravity: 1.012 (ref 1.003–1.030)

## 2014-10-02 LAB — COMPREHENSIVE METABOLIC PANEL
ANION GAP: 8 (ref 7–16)
Albumin: 4.3 g/dL
Alkaline Phosphatase: 126 U/L
BILIRUBIN TOTAL: 0.5 mg/dL
BUN: 13 mg/dL
CHLORIDE: 109 mmol/L
CO2: 21 mmol/L — AB
CREATININE: 0.87 mg/dL
Calcium, Total: 9.2 mg/dL
Glucose: 111 mg/dL — ABNORMAL HIGH
POTASSIUM: 4.3 mmol/L
SGOT(AST): 25 U/L
SGPT (ALT): 14 U/L
SODIUM: 138 mmol/L
Total Protein: 7.6 g/dL

## 2014-10-02 LAB — DRUG SCREEN, URINE
AMPHETAMINES, UR SCREEN: NEGATIVE
Barbiturates, Ur Screen: NEGATIVE
Benzodiazepine, Ur Scrn: POSITIVE
COCAINE METABOLITE, UR ~~LOC~~: NEGATIVE
Cannabinoid 50 Ng, Ur ~~LOC~~: NEGATIVE
MDMA (Ecstasy)Ur Screen: NEGATIVE
Methadone, Ur Screen: NEGATIVE
OPIATE, UR SCREEN: NEGATIVE
Phencyclidine (PCP) Ur S: NEGATIVE
TRICYCLIC, UR SCREEN: NEGATIVE

## 2014-10-02 LAB — ETHANOL

## 2014-10-02 LAB — SALICYLATE LEVEL

## 2014-10-02 LAB — ACETAMINOPHEN LEVEL

## 2014-11-10 ENCOUNTER — Ambulatory Visit: Payer: Self-pay | Admitting: Psychiatry

## 2014-11-25 ENCOUNTER — Ambulatory Visit: Payer: Self-pay | Admitting: Psychiatry
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
## 2014-12-02 ENCOUNTER — Ambulatory Visit: Payer: Self-pay | Admitting: Psychiatry

## 2015-01-05 ENCOUNTER — Emergency Department
Admission: EM | Admit: 2015-01-05 | Discharge: 2015-01-06 | Disposition: A | Discharge status: Home / Self Care | Payer: Self-pay | Attending: Emergency Medicine | Admitting: Emergency Medicine

## 2015-01-05 ENCOUNTER — Encounter: Payer: Self-pay | Admitting: *Deleted

## 2015-01-05 DIAGNOSIS — Z79899 Other long term (current) drug therapy: Secondary | ICD-10-CM | POA: Insufficient documentation

## 2015-01-05 DIAGNOSIS — Z720 Tobacco use: Secondary | ICD-10-CM | POA: Insufficient documentation

## 2015-01-05 DIAGNOSIS — Z3202 Encounter for pregnancy test, result negative: Secondary | ICD-10-CM | POA: Insufficient documentation

## 2015-01-05 DIAGNOSIS — R4689 Other symptoms and signs involving appearance and behavior: Secondary | ICD-10-CM

## 2015-01-05 DIAGNOSIS — F1410 Cocaine abuse, uncomplicated: Secondary | ICD-10-CM | POA: Insufficient documentation

## 2015-01-05 DIAGNOSIS — F1210 Cannabis abuse, uncomplicated: Secondary | ICD-10-CM | POA: Insufficient documentation

## 2015-01-05 DIAGNOSIS — F1490 Cocaine use, unspecified, uncomplicated: Secondary | ICD-10-CM

## 2015-01-05 LAB — URINALYSIS COMPLETE WITH MICROSCOPIC (ARMC ONLY)
BACTERIA UA: NONE SEEN
Bilirubin Urine: NEGATIVE
GLUCOSE, UA: NEGATIVE mg/dL
Hgb urine dipstick: NEGATIVE
Nitrite: NEGATIVE
PH: 5 (ref 5.0–8.0)
Protein, ur: NEGATIVE mg/dL
SPECIFIC GRAVITY, URINE: 1.019 (ref 1.005–1.030)

## 2015-01-05 LAB — PREGNANCY, URINE: Preg Test, Ur: NEGATIVE

## 2015-01-05 LAB — CBC
HCT: 39.7 % (ref 35.0–47.0)
Hemoglobin: 13.3 g/dL (ref 12.0–16.0)
MCH: 33.3 pg (ref 26.0–34.0)
MCHC: 33.4 g/dL (ref 32.0–36.0)
MCV: 99.5 fL (ref 80.0–100.0)
PLATELETS: 331 10*3/uL (ref 150–440)
RBC: 3.99 MIL/uL (ref 3.80–5.20)
RDW: 13.6 % (ref 11.5–14.5)
WBC: 10.8 10*3/uL (ref 3.6–11.0)

## 2015-01-05 LAB — ACETAMINOPHEN LEVEL: Acetaminophen (Tylenol), Serum: <10 ug/mL — ABNORMAL LOW (ref 10–30)

## 2015-01-05 LAB — URINE DRUG SCREEN, QUALITATIVE (ARMC ONLY)
AMPHETAMINES, UR SCREEN: NOT DETECTED
Barbiturates, Ur Screen: NOT DETECTED
Benzodiazepine, Ur Scrn: POSITIVE — AB
Cannabinoid 50 Ng, Ur ~~LOC~~: NOT DETECTED
Cocaine Metabolite,Ur ~~LOC~~: POSITIVE — AB
MDMA (ECSTASY) UR SCREEN: NOT DETECTED
METHADONE SCREEN, URINE: NOT DETECTED
OPIATE, UR SCREEN: NOT DETECTED
PHENCYCLIDINE (PCP) UR S: NOT DETECTED
TRICYCLIC, UR SCREEN: NOT DETECTED

## 2015-01-05 LAB — ETHANOL

## 2015-01-05 LAB — COMPREHENSIVE METABOLIC PANEL
ALBUMIN: 4.2 g/dL (ref 3.5–5.0)
ALK PHOS: 102 U/L (ref 38–126)
ALT: 19 U/L (ref 14–54)
ANION GAP: 9 (ref 5–15)
AST: 23 U/L (ref 15–41)
BUN: 16 mg/dL (ref 6–20)
CHLORIDE: 110 mmol/L (ref 101–111)
CO2: 24 mmol/L (ref 22–32)
CREATININE: 0.96 mg/dL (ref 0.44–1.00)
Calcium: 9.9 mg/dL (ref 8.9–10.3)
GFR calc Af Amer: >60 mL/min (ref >60)
GFR calc non Af Amer: >60 mL/min (ref >60)
Glucose, Bld: 96 mg/dL (ref 65–99)
POTASSIUM: 3.8 mmol/L (ref 3.5–5.1)
Sodium: 143 mmol/L (ref 135–145)
Total Bilirubin: 0.5 mg/dL (ref 0.3–1.2)
Total Protein: 7.4 g/dL (ref 6.5–8.1)

## 2015-01-05 LAB — SALICYLATE LEVEL: Salicylate Lvl: <4 mg/dL (ref 2.8–30.0)

## 2015-01-05 NOTE — Discharge Instructions (Signed)
Cocaine °Cocaine stimulates the central nervous system. As a stimulant, cocaine has the ability to improve athletic performance through increasing speed, endurance, and concentration, as well as decreasing fatigue. Although cocaine may seem to be beneficial for athletics, it is highly addicting and has many debilitating side effects. Cocaine has caused the deaths of many athletes, and its use is banned by every major athletic organization in the world. The clinical effect of cocaine (the high) is very short in duration. Cocaine works in the brain by altering the normal concentrations of chemicals that stimulate the brain cells.  °WHY ATHLETES USE IT  °Many athletes use cocaine for its central nervous system stimulating properties. It is also used as a recreational drug due to the euphoric felling it produces.  °ADVERSE EFFECTS  °· Sleep disturbances. °· Abnormal heart rhythms. °· Stroke. °· Heart attack. °· Seizures. °· Elevated blood pressure. °· Death. °· Paranoia (feeling that people want to hurt you). °· Panic attacks (sudden feelings of anxiety or shortness of breath). °· Suicidal behavior (wanting to kill yourself). °· Homicidal behavior (wanting to kill other people). °· Depression (feeling very sad, having decreased energy for activities). °· Poor athletic performance. °PHARMACOLOGY  °Cocaine acts on the body for a short period of time; the clinical effects may last less than1 hour. Since most athletic competitions last for more than 1 hour, cocaine use may not improve athletic performance. The use of cocaine makes individuals much more susceptible for serious conditions such as seizures, arrhythmia (irregular heart beat), and strokes. Even a single dose of cocaine can be detected on a drug test for up to about 30 hours.  °PREVENTION °Most athletes use cocaine as a recreational drug and not for the purpose of enhancing athletic performance. To prevent the use of cocaine, athletes must be educated on its side  effects and the risk of addiction. If an athlete is found using cocaine, counseling and treatment are almost always required.  °Document Released: 05/23/2005 Document Revised: 08/15/2011 Document Reviewed: 09/04/2008 °Exit[employer]® Patient Information ©2015 [employer]. This information is not intended to replace advice given to you by your health care provider. Make sure you discuss any questions you have with your health care provider. ° °
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
## 2015-01-05 NOTE — ED Notes (Signed)
Pt brought in by bpd with IVC.  Pt denies SI or HI.  Pt reports doing cocaine today.

## 2015-01-05 NOTE — ED Provider Notes (Signed)
[facility] Emergency Department Provider Note  ____________________________________________  Time seen: Approximately 830 PM  I have reviewed the triage vital signs and the nursing notes.   HISTORY  Chief Complaint Behavior Problem    HPI Jennifer Swanson is a 49 y.o. female with a history of bipolar and anxiety disorder who presents today after being IVC by the [facility] [employer] for discharge and behavior at home as well as threatening other people. The patient says that she was not destroying anything in her home, not threatening anybody. Denies any suicidal or homicidal ideation at this time. Admits to doing one line of cocaine this afternoon which she says she hasn't done in nearly 2 years. She does admit to smoking cigarettes.  Says that since her husband had a stroke 2 years ago that she has been having tension between her and her husband sons. She says that she did do cocaine this afternoon because she was stressed. Denies any hallucinations.   Past Medical History  Diagnosis Date  . Sinusitis   . Bronchitis   . Pneumonia   . Asthma   . COPD (chronic obstructive pulmonary disease)   . Hemorrhoids   . Hyperlipidemia   . MVP (mitral valve prolapse)   . Poliomyelitis   . Bipolar disorder   . Anxiety disorder   . Hay fever     There are no active problems to display for this patient.   History reviewed. No pertinent past surgical history.  Current Outpatient Rx  Name  Route  Sig  Dispense  Refill  . citalopram (CELEXA) 40 MG tablet   Oral   Take 40 mg by mouth daily.         . clonazePAM (KLONOPIN) 0.5 MG tablet   Oral   Take 0.5 mg by mouth 2 (two) times daily as needed for anxiety.           Allergies Review of patient's allergies indicates no known allergies.  No family history on file.  Social History History  Substance Use Topics  . Smoking status: Current Every Day Smoker  . Smokeless tobacco: Not on file   . Alcohol Use: No    Review of Systems Constitutional: No fever/chills Eyes: No visual changes. ENT: No sore throat. Cardiovascular: Denies chest pain. Respiratory: Denies shortness of breath. Gastrointestinal: No abdominal pain.  No nausea, no vomiting.  No diarrhea.  No constipation. Genitourinary: Negative for dysuria. Musculoskeletal: Negative for back pain. Skin: Negative for rash. Neurological: Negative for headaches, focal weakness or numbness.  10-point ROS otherwise negative.  ____________________________________________   PHYSICAL EXAM:  VITAL SIGNS: ED Triage Vitals  Enc Vitals Group     BP 01/05/15 2010 128/70 mmHg     Pulse Rate 01/05/15 2010 88     Resp 01/05/15 2010 20     Temp 01/05/15 2010 98.6 F (37 C)     Temp Source 01/05/15 2010 Oral     SpO2 01/05/15 2010 99 %     Weight 01/05/15 2010 230 lb (104.327 kg)     Height 01/05/15 2010  (1.778 m)     Head Cir --      Peak Flow --      Pain Score 01/05/15 2012 4     Pain Loc --      Pain Edu? --      Excl. in GC? --     Constitutional: Alert and oriented. Well appearing and in no acute distress. Eyes: Conjunctivae  are normal. PERRL. EOMI. Head: Atraumatic. Nose: No congestion/rhinnorhea. Mouth/Throat: Mucous membranes are moist.  Oropharynx non-erythematous. Neck: No stridor.   Cardiovascular: Normal rate, regular rhythm. Grossly normal heart sounds.  Good peripheral circulation. Respiratory: Normal respiratory effort.  No retractions. Lungs CTAB. Gastrointestinal: Soft and nontender. No distention. No abdominal bruits. No CVA tenderness. Musculoskeletal: No lower extremity tenderness nor edema.  No joint effusions. Neurologic:  Normal speech and language. No gross focal neurologic deficits are appreciated. No gait instability. Skin:  Skin is warm, dry and intact. No rash noted. Psychiatric: Mood and affect are normal. Speech and behavior are  normal.  ____________________________________________   LABS (all labs ordered are listed, but only abnormal results are displayed)  Labs Reviewed  URINE DRUG SCREEN, QUALITATIVE ([facility] ONLY) - Abnormal; Notable for the following:    Cocaine Metabolite,Ur [place] POSITIVE (*)    Benzodiazepine, Ur Scrn POSITIVE (*)    All other components within normal limits  ACETAMINOPHEN LEVEL - Abnormal; Notable for the following:    Acetaminophen (Tylenol), Serum <10 (*)    All other components within normal limits  URINALYSIS COMPLETEWITH MICROSCOPIC ([facility] ONLY) - Abnormal; Notable for the following:    Color, Urine YELLOW (*)    APPearance HAZY (*)    Ketones, ur TRACE (*)    Leukocytes, UA 2+ (*)    Squamous Epithelial / LPF 0-5 (*)    All other components within normal limits  COMPREHENSIVE METABOLIC PANEL  ETHANOL  CBC  SALICYLATE LEVEL  PREGNANCY, URINE   ____________________________________________  EKG   ____________________________________________  RADIOLOGY   ____________________________________________   PROCEDURES  ____________________________________________   INITIAL IMPRESSION / ASSESSMENT AND PLAN / ED COURSE  Pertinent labs & imaging results that were available during my care of the patient were reviewed by me and considered in my medical decision making (see chart for details).  ----------------------------------------- 11:50 PM on 01/05/2015 -----------------------------------------  Discussed case with Dr.Breuer via telephone after he had evaluated the patient. Feels that the patient may be discharged safely to home. I discussed with the patient who continues to be without any suicidal or homicidal ideation. She says that she feels safe going home and that she does not feel like she will have any physical altercation with her stepchildren. Furthermore, she is clinically sober. Vital signs are all within normal limits. She will follow-up with her  nonpsychiatrist. ____________________________________________   FINAL CLINICAL IMPRESSION(S) / ED DIAGNOSES  Aggressive behavior. Initial visit.     Myrna Blazer, MD 01/05/15 [phone]
